# Patient Record
Sex: Male | Born: 1985 | Race: White | Hispanic: No | Marital: Married | Smoking: Never smoker
Health system: Southern US, Community
[De-identification: ages and names within clinical notes are randomized; demographics above are authoritative.]

## PROBLEM LIST (undated history)

## (undated) ENCOUNTER — Emergency Department (HOSPITAL_BASED_OUTPATIENT_CLINIC_OR_DEPARTMENT_OTHER): Admission: EM | Source: Home / Self Care

## (undated) DIAGNOSIS — K219 Gastro-esophageal reflux disease without esophagitis: Secondary | ICD-10-CM

---

## 2006-11-28 ENCOUNTER — Emergency Department (HOSPITAL_COMMUNITY): Admission: EM | Admit: 2006-11-28 | Discharge: 2006-11-28 | Payer: Self-pay | Admitting: Emergency Medicine

## 2007-08-31 ENCOUNTER — Ambulatory Visit: Payer: Self-pay | Admitting: Gastroenterology

## 2007-10-27 DIAGNOSIS — K625 Hemorrhage of anus and rectum: Secondary | ICD-10-CM

## 2007-10-27 DIAGNOSIS — F411 Generalized anxiety disorder: Secondary | ICD-10-CM | POA: Insufficient documentation

## 2007-10-27 DIAGNOSIS — F329 Major depressive disorder, single episode, unspecified: Secondary | ICD-10-CM

## 2007-10-27 DIAGNOSIS — F3289 Other specified depressive episodes: Secondary | ICD-10-CM | POA: Insufficient documentation

## 2011-02-17 NOTE — Letter (Signed)
August 31, 2007    Eduardo Peterson   RE:  TREVINO, WYATT  MRN:  [id number]  /  DOB:  [birth year]   Dear Francee Piccolo:   It is my pleasure to have treated you recently as a new patient in my  office.  I appreciate your confidence and the opportunity to participate  in your care.   Since I do have a busy inpatient endoscopy schedule and office schedule,  my office hours vary weekly.  I am, however, available for emergency  calls every day through my office.  If I cannot promptly meet an urgent  office appointment, another one of our gastroenterologists will be able  to assist you.   My well-trained staff are prepared to help you at all times.  For  emergencies after office hours, a physician from our gastroenterology  section is always available through my 24-hour answering service.   While you are under my care, I encourage discussion of your questions  and concerns, and I will be happy to return your calls as soon as I am  available.   Once again, I welcome you as a new patient and I look forward to a happy  and healthy relationship.    Sincerely,      Eduardo Hair. Arlyce Dice, MD,FACG  Electronically Signed   RDK/MedQ  DD: 08/31/2007  DT: 08/31/2007  Job #: [id number]
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
## 2011-09-22 ENCOUNTER — Other Ambulatory Visit: Payer: Self-pay

## 2011-09-22 ENCOUNTER — Emergency Department (HOSPITAL_COMMUNITY)
Admission: EM | Admit: 2011-09-22 | Discharge: 2011-09-23 | Disposition: A | Discharge status: Home / Self Care | Attending: Emergency Medicine | Admitting: Emergency Medicine

## 2011-09-22 ENCOUNTER — Encounter: Payer: Self-pay | Admitting: Emergency Medicine

## 2011-09-22 DIAGNOSIS — R142 Eructation: Secondary | ICD-10-CM | POA: Insufficient documentation

## 2011-09-22 DIAGNOSIS — Z79899 Other long term (current) drug therapy: Secondary | ICD-10-CM | POA: Insufficient documentation

## 2011-09-22 DIAGNOSIS — R141 Gas pain: Secondary | ICD-10-CM | POA: Insufficient documentation

## 2011-09-22 DIAGNOSIS — R0789 Other chest pain: Secondary | ICD-10-CM | POA: Insufficient documentation

## 2011-09-22 DIAGNOSIS — R112 Nausea with vomiting, unspecified: Secondary | ICD-10-CM | POA: Insufficient documentation

## 2011-09-22 DIAGNOSIS — R1915 Other abnormal bowel sounds: Secondary | ICD-10-CM | POA: Insufficient documentation

## 2011-09-22 NOTE — ED Notes (Signed)
Pt. Received from triage via w/c pt. To stretcher with assist. Pt. Unsteady, mother reports multiple seizure episodes today, pt. Is visiting from [facility], mother reports calling pt's neuro today she has number of on call doctor,

## 2011-09-22 NOTE — ED Notes (Signed)
PT. REPORTS FREQUENT "BURPING " WITH NAUSEA FOR SEVERAL WEEKS , DIAGNOSED WITH GERD BY PCP , REFERRED TO GI MD FOR CONSULT , ALSO REPORTS CHEST CONGESTION .

## 2011-09-23 ENCOUNTER — Emergency Department (HOSPITAL_COMMUNITY)

## 2011-09-23 LAB — POCT I-STAT, CHEM 8
BUN: 13 mg/dL (ref 6–23)
Calcium, Ion: 1.24 mmol/L (ref 1.12–1.32)
Creatinine, Ser: 1 mg/dL (ref 0.50–1.35)
Glucose, Bld: 104 mg/dL — ABNORMAL HIGH (ref 70–99)
HCT: 41 % (ref 39.0–52.0)
Sodium: 142 mEq/L (ref 135–145)

## 2011-09-23 LAB — DIFFERENTIAL
Eosinophils Absolute: 0.1 10*3/uL (ref 0.0–0.7)
Eosinophils Relative: 1 % (ref 0–5)
Lymphs Abs: 1.6 10*3/uL (ref 0.7–4.0)
Monocytes Absolute: 0.5 10*3/uL (ref 0.1–1.0)
Monocytes Relative: 6 % (ref 3–12)

## 2011-09-23 LAB — CBC
HCT: 39.6 % (ref 39.0–52.0)
MCH: 31.6 pg (ref 26.0–34.0)
MCV: 88 fL (ref 78.0–100.0)
Platelets: 229 10*3/uL (ref 150–400)
RBC: 4.5 MIL/uL (ref 4.22–5.81)

## 2011-09-23 MED ORDER — ONDANSETRON 8 MG PO TBDP: ORAL_TABLET | ORAL | Status: AC

## 2011-09-23 MED ORDER — ONDANSETRON HCL 4 MG/2ML IJ SOLN
4.0000 mg | Freq: Once | INTRAMUSCULAR | Status: AC
  Administered 2011-09-23: 4 mg via INTRAVENOUS
  Filled 2011-09-23: qty 2

## 2011-09-23 MED ORDER — GI COCKTAIL ~~LOC~~
30.0000 mL | Freq: Once | ORAL | Status: AC
  Administered 2011-09-23: 30 mL via ORAL
  Filled 2011-09-23: qty 30

## 2011-09-23 MED ORDER — METOCLOPRAMIDE HCL 5 MG/ML IJ SOLN
10.0000 mg | Freq: Once | INTRAMUSCULAR | Status: AC
  Administered 2011-09-23: 10 mg via INTRAVENOUS
  Filled 2011-09-23: qty 2

## 2011-09-23 MED ORDER — SUCRALFATE 1 G PO TABS: 1.0000 g | ORAL_TABLET | Freq: Four times a day (QID) | ORAL | Status: AC

## 2011-09-23 MED ORDER — PROMETHAZINE HCL 25 MG PO TABS: 25.0000 mg | ORAL_TABLET | Freq: Four times a day (QID) | ORAL | Status: AC | PRN

## 2011-09-23 NOTE — Discharge Instructions (Signed)
Please continue to followup with your primary doctor and your GI specialist. If you have worsening symptoms, persistent nausea and vomiting please return to the emergency room.  Clear Liquid Diet The clear liquid dietconsists of foods that are liquid or will become liquid at room temperature.You should be able to see through the liquid and beverages. Examples of foods allowed on a clear liquid diet include fruit juice, broth or bouillon, gelatin, or frozen ice pops. The purpose of this diet is to provide necessary fluid, electrolytes such a sodium and potassium, and energy to keep the body functioning during times when you are not able to consume a regular diet.A clear liquid diet should not be continued for long periods of time as it is not nutritionally adequate.  REASONS FOR USING A CLEAR LIQUID DIET  In sudden onset (acute) conditions for a patient before or after surgery.   As the first step in oral feeding.   For fluid and electrolyte replacement in diarrheal diseases.   As a diet before certain medical tests are performed.  ADEQUACY The clear liquid diet is adequate only in ascorbic acid, according to the Recommended Dietary Allowances of the [employer]. CHOOSING FOODS Breads and Starches  Allowed:  None are allowed.   Avoid: All are avoided.  Vegetables  Allowed:  Strained tomato or vegetable juice.   Avoid: Any others.  Fruit  Allowed:  Strained fruit juices and fruit drinks. Include 1 serving of citrus or vitamin C-enriched fruit juice daily.   Avoid: Any others.  Meat and Meat Substitutes  Allowed:  None are allowed.   Avoid: All are avoided.  Milk  Allowed:  None are allowed.   Avoid: All are avoided.  Soups and Combination Foods  Allowed:  Clear bouillon, broth, or strained broth-based soups.   Avoid: Any others.  Desserts and Sweets  Allowed:  Sugar, honey. High protein gelatin. Flavored gelatin, ices, or frozen ice pops that do not  contain milk.   Avoid: Any others.  Fats and Oils  Allowed:  None are allowed.   Avoid: All are avoided.  Beverages  Allowed:  Carbonated beverages, cereal beverages, coffee (regular or decaffeinated), or tea.   Avoid: Any others.  Condiments  Allowed:  Iodized salt.   Avoid: Any others, including pepper.  Supplements  Allowed:  Liquid nutrition beverages.   Avoid: Any others that contain lactose or fiber.  SAMPLE MEAL PLAN Breakfast  4 oz strained orange juice.    to 1 cup gelatin (plain or fortified).   1 cup beverage (coffee or tea).   Sugar, if desired.  Midmorning Snack   cup gelatin (plain or fortified).  Lunch  1 cup broth or consomm.   4 oz strained grapefruit juice.    cup gelatin (plain or fortified).   1 cup beverage (coffee or tea).   Sugar, if desired.  Midafternoon Snack   cup fruit ice.    cup strained fruit juice.  Dinner  1 cup broth or consomm.    cup cranberry juice.    cup flavored gelatin (plain or fortified).   1 cup beverage (coffee or tea).   Sugar, if desired.  Evening Snack  4 oz strained apple juice (vitamin C-fortified).    cup flavored gelatin (plain or fortified).  Document Released: 09/21/2005 Document Revised: 06/03/2011 Document Reviewed: 12/19/2010 [facility] Patient Information 2012 [place], [place].

## 2011-09-23 NOTE — ED Notes (Signed)
Pt. Alert and oriented, discharged to home ambulatory, gait steady, NAD noted

## 2011-09-23 NOTE — ED Provider Notes (Signed)
Medical screening examination/treatment/procedure(s) were performed by non-physician practitioner and as supervising physician I was immediately available for consultation/collaboration.   Ledger Heindl L Soul Hackman, MD 09/23/11 0518 

## 2011-09-23 NOTE — ED Provider Notes (Addendum)
History     CSN: [id number] Arrival date & time: 09/22/2011 10:38 PM   First MD Initiated Contact with Patient 09/23/11 0136      Chief Complaint  Patient presents with  . Nausea  . Emesis     HPI   History provided by the patient and family. Patient is 25 year old male who presents with symptoms of persistent belching, nausea, vomiting and chest pressure and pain. Patient reports history of similar intermittent symptoms for the past several weeks. He recently saw GI specialist who started him on an acid medications for possible GERD symptoms. he reports taking this regularly as instructed without any significant change. Today symptoms were much worse and persistent. Patient reports having a belch once every minute without stop. Patient denies having hiccups. he does have a followup with his GI specialist for an endoscopy and colonoscopy on January 8. he has no other significant past medical history.   History reviewed. No pertinent past medical history.  History reviewed. No pertinent past surgical history.  No family history on file.  History  Substance Use Topics  . Smoking status: Never Smoker   . Smokeless tobacco: Not on file  . Alcohol Use: No      Review of Systems  Constitutional: Negative for fever and chills.  HENT: Negative for sore throat.   Respiratory: Negative for cough and shortness of breath.   Gastrointestinal: Positive for nausea and vomiting. Negative for abdominal pain, diarrhea and constipation.  All other systems reviewed and are negative.    Allergies  Review of patient's allergies indicates no known allergies.  Home Medications   Current Outpatient Rx  Name Route Sig Dispense Refill  . OMEPRAZOLE 20 MG PO CPDR Oral Take 20 mg by mouth 2 (two) times daily.        BP 122/74  Pulse 101  Temp(Src) 98.2 F (36.8 C) (Oral)  Resp 22  SpO2 97%  Physical Exam  Nursing note and vitals reviewed. Constitutional: He is oriented to person,  place, and time. He appears well-developed and well-nourished. No distress.  HENT:  Head: Normocephalic.  Mouth/Throat: Oropharynx is clear and moist.  Neck: Normal range of motion. Neck supple. No tracheal deviation present.       No crepitus  Cardiovascular: Normal rate, regular rhythm and normal heart sounds.   Pulmonary/Chest: Effort normal and breath sounds normal. No respiratory distress. He has no wheezes. He has no rales.  Abdominal: Soft. He exhibits no distension. Bowel sounds are increased. There is no tenderness. There is no rebound and no guarding.  Neurological: He is alert and oriented to person, place, and time.  Skin: Skin is warm.  Psychiatric: He has a normal mood and affect. His behavior is normal.    ED Course  Procedures (including critical care time)  Labs Reviewed  POCT I-STAT, CHEM 8 - Abnormal; Notable for the following:    Glucose, Bld 104 (*)    All other components within normal limits  CBC  DIFFERENTIAL  I-STAT, CHEM 8   Results for orders placed during the hospital encounter of 09/22/11  CBC      Component Value Range   WBC 8.8  4.0 - 10.5 (K/uL)   RBC 4.50  4.22 - 5.81 (MIL/uL)   Hemoglobin 14.2  13.0 - 17.0 (g/dL)   HCT 40.9  81.1 - 91.4 (%)   MCV 88.0  78.0 - 100.0 (fL)   MCH 31.6  26.0 - 34.0 (pg)   MCHC 35.9  30.0 -  36.0 (g/dL)   RDW 96.0  45.4 - 09.8 (%)   Platelets 229  150 - 400 (K/uL)  DIFFERENTIAL      Component Value Range   Neutrophils Relative 75  43 - 77 (%)   Neutro Abs 6.6  1.7 - 7.7 (K/uL)   Lymphocytes Relative 18  12 - 46 (%)   Lymphs Abs 1.6  0.7 - 4.0 (K/uL)   Monocytes Relative 6  3 - 12 (%)   Monocytes Absolute 0.5  0.1 - 1.0 (K/uL)   Eosinophils Relative 1  0 - 5 (%)   Eosinophils Absolute 0.1  0.0 - 0.7 (K/uL)   Basophils Relative 0  0 - 1 (%)   Basophils Absolute 0.0  0.0 - 0.1 (K/uL)  POCT I-STAT, CHEM 8      Component Value Range   Sodium 142  135 - 145 (mEq/L)   Potassium 3.8  3.5 - 5.1 (mEq/L)   Chloride  106  96 - 112 (mEq/L)   BUN 13  6 - 23 (mg/dL)   Creatinine, Ser 1.19  0.50 - 1.35 (mg/dL)   Glucose, Bld 147 (*) 70 - 99 (mg/dL)   Calcium, Ion 8.29  5.62 - 1.32 (mmol/L)   TCO2 26  0 - 100 (mmol/L)   Hemoglobin 13.9  13.0 - 17.0 (g/dL)   HCT 13.0  86.5 - 78.4 (%)     Dg Chest 2 View  09/23/2011  *RADIOLOGY REPORT*  Clinical Data: Mid chest pain  CHEST - 2 VIEW  Comparison: None.  Findings: Lungs are clear. No pleural effusion or pneumothorax. The cardiomediastinal contours are within normal limits. The visualized bones and soft tissues are without significant appreciable abnormality.  IMPRESSION: No acute cardiopulmonary process.  Original Report Authenticated By: Waneta Martins, M.D.     1. Nausea & vomiting      MDM  1:35 AM patient seen and evaluated. Patient in no acute distress. he reports some improvements of symptoms after Zofran.   Pt continues to do well and has not had any belching or N/V.  Pt feels ready to return home.    Date: 09/23/2011  Rate: 87  Rhythm: normal sinus rhythm and sinus arrhythmia  QRS Axis: normal  Intervals: normal  ST/T Wave abnormalities: normal  Conduction Disutrbances:nonspecific intraventricular conduction delay  Narrative Interpretation:   Old EKG Reviewed: none available    Angus Seller, PA 09/23/11 0419  Angus Seller, PA 09/23/11 [phone]

## 2017-01-10 ENCOUNTER — Emergency Department (HOSPITAL_BASED_OUTPATIENT_CLINIC_OR_DEPARTMENT_OTHER)

## 2017-01-10 ENCOUNTER — Emergency Department (HOSPITAL_BASED_OUTPATIENT_CLINIC_OR_DEPARTMENT_OTHER)
Admission: EM | Admit: 2017-01-10 | Discharge: 2017-01-11 | Disposition: A | Discharge status: Home / Self Care | Attending: Emergency Medicine | Admitting: Emergency Medicine

## 2017-01-10 ENCOUNTER — Encounter (HOSPITAL_BASED_OUTPATIENT_CLINIC_OR_DEPARTMENT_OTHER): Payer: Self-pay | Admitting: *Deleted

## 2017-01-10 DIAGNOSIS — R0789 Other chest pain: Secondary | ICD-10-CM | POA: Diagnosis not present

## 2017-01-10 DIAGNOSIS — Z87891 Personal history of nicotine dependence: Secondary | ICD-10-CM | POA: Diagnosis not present

## 2017-01-10 HISTORY — DX: Gastro-esophageal reflux disease without esophagitis: K21.9

## 2017-01-10 LAB — CBC WITH DIFFERENTIAL/PLATELET
BASOS PCT: 0 %
Basophils Absolute: 0 10*3/uL (ref 0.0–0.1)
EOS PCT: 5 %
Eosinophils Absolute: 0.4 10*3/uL (ref 0.0–0.7)
HCT: 43 % (ref 39.0–52.0)
HEMOGLOBIN: 15.6 g/dL (ref 13.0–17.0)
LYMPHS ABS: 2.4 10*3/uL (ref 0.7–4.0)
Lymphocytes Relative: 33 %
MCH: 31.7 pg (ref 26.0–34.0)
MCHC: 36.3 g/dL — AB (ref 30.0–36.0)
MCV: 87.4 fL (ref 78.0–100.0)
Monocytes Absolute: 0.6 10*3/uL (ref 0.1–1.0)
Monocytes Relative: 8 %
Neutro Abs: 4.1 10*3/uL (ref 1.7–7.7)
Neutrophils Relative %: 54 %
Platelets: 259 10*3/uL (ref 150–400)
RBC: 4.92 MIL/uL (ref 4.22–5.81)
RDW: 12.4 % (ref 11.5–15.5)
WBC: 7.5 10*3/uL (ref 4.0–10.5)

## 2017-01-10 LAB — BASIC METABOLIC PANEL
Anion gap: 8 (ref 5–15)
BUN: 13 mg/dL (ref 6–20)
CO2: 27 mmol/L (ref 22–32)
CREATININE: 0.92 mg/dL (ref 0.61–1.24)
Calcium: 9.1 mg/dL (ref 8.9–10.3)
Chloride: 103 mmol/L (ref 101–111)
GFR calc Af Amer: >60 mL/min (ref >60)
Glucose, Bld: 108 mg/dL — ABNORMAL HIGH (ref 65–99)
Potassium: 3.4 mmol/L — ABNORMAL LOW (ref 3.5–5.1)
SODIUM: 138 mmol/L (ref 135–145)

## 2017-01-10 LAB — D-DIMER, QUANTITATIVE: D-Dimer, Quant: <0.27 ug/mL-FEU (ref 0.00–0.50)

## 2017-01-10 LAB — TROPONIN I

## 2017-01-10 NOTE — ED Provider Notes (Signed)
[facility] Provider Note   CSN: [id number] Arrival date & time: 01/10/17  1747  By signing my name below, I, [name]., attest that this documentation has been prepared under the direction and in the presence of Rolan Bucco, MD. Electronically signed: [name]., ED Scribe. 01/10/17. 10:24 PM.   History   Chief Complaint Chief Complaint  Patient presents with  . Chest Pain    HPI Eduardo Peterson is a 31 y.o. male with hx of panic attack who presents to the Emergency Department complaining of constant, mild chest pain with onset x2 weeks. Pt states that for the past x2 weeks he has had a constant dull chest pain with an occasional sharp pain, but he has been unable to come into the ED due to work. x6 hours ago, pt states that he experienced an "excruciating" sharp central chest pain. He denies any modifying factors. Pt denies SOB, cough, congestion, fever, nausea, vomiting, diarrhea, leg swelling. Pt denies hx of heart complication. He reports family hx of stroke. Pt denies smoking. He reports a trip to [place] x1 month ago. Of note, pt states that his ankles were abnormally swollen x2 weeks ago.    The history is provided by the patient. No language interpreter was used.    Past Medical History:  Diagnosis Date  . GERD (gastroesophageal reflux disease)     Patient Active Problem List   Diagnosis Date Noted  . ANXIETY 10/27/2007  . DEPRESSION 10/27/2007  . RECTAL BLEEDING 10/27/2007    History reviewed. No pertinent surgical history.     Home Medications    Prior to Admission medications   Medication Sig Start Date End Date Taking? Authorizing Provider  omeprazole (PRILOSEC) 20 MG capsule Take 20 mg by mouth 2 (two) times daily.      Historical Provider, MD  sucralfate (CARAFATE) 1 G tablet Take 1 tablet (1 g total) by mouth 4 (four) times daily. 09/23/11 09/22/12  Ivonne Andrew, PA-C    Family History No family history on  file.  Social History Social History  Substance Use Topics  . Smoking status: Never Smoker  . Smokeless tobacco: Former Neurosurgeon  . Alcohol use Yes     Comment: occ     Allergies   Patient has no known allergies.   Review of Systems Review of Systems  Constitutional: Negative for chills, diaphoresis, fatigue and fever.  HENT: Negative for congestion, rhinorrhea and sneezing.   Eyes: Negative.   Respiratory: Negative for cough, chest tightness and shortness of breath.   Cardiovascular: Positive for chest pain. Negative for leg swelling.  Gastrointestinal: Negative for abdominal pain, blood in stool, diarrhea, nausea and vomiting.  Genitourinary: Negative for difficulty urinating, flank pain, frequency and hematuria.  Musculoskeletal: Negative for arthralgias and back pain.  Skin: Negative for rash.  Neurological: Negative for dizziness, speech difficulty, weakness, numbness and headaches.     Physical Exam Updated Vital Signs BP (!) 121/91 (BP Location: Right Arm)   Pulse 71   Resp 14   SpO2 99%   Physical Exam  Constitutional: He is oriented to person, place, and time. He appears well-developed and well-nourished.  HENT:  Head: Normocephalic and atraumatic.  Eyes: Pupils are equal, round, and reactive to light.  Neck: Normal range of motion. Neck supple.  Cardiovascular: Normal rate, regular rhythm and normal heart sounds.   Pulmonary/Chest: Effort normal and breath sounds normal. No respiratory distress. He has no wheezes. He has no rales.  He exhibits no tenderness.  Abdominal: Soft. Bowel sounds are normal. There is no tenderness. There is no rebound and no guarding.  Musculoskeletal: Normal range of motion. He exhibits no edema.       Right lower leg: He exhibits no tenderness and no edema.       Left lower leg: He exhibits no tenderness and no edema.  No edema or calf tenderness.   Lymphadenopathy:    He has no cervical adenopathy.  Neurological: He is alert and  oriented to person, place, and time.  Skin: Skin is warm and dry. No rash noted.  Psychiatric: He has a normal mood and affect.     ED Treatments / Results   DIAGNOSTIC STUDIES:   COORDINATION OF CARE: 10:24 PM-Discussed next steps with pt. Pt verbalized understanding and is agreeable with the plan.    Labs (all labs ordered are listed, but only abnormal results are displayed) Labs Reviewed  BASIC METABOLIC PANEL - Abnormal; Notable for the following:       Result Value   Potassium 3.4 (*)    Glucose, Bld 108 (*)    All other components within normal limits  CBC WITH DIFFERENTIAL/PLATELET - Abnormal; Notable for the following:    MCHC 36.3 (*)    All other components within normal limits  D-DIMER, QUANTITATIVE (NOT AT [facility])  TROPONIN I    EKG  EKG Interpretation  Date/Time:  Sunday January 10 2017 17:54:51 EDT Ventricular Rate:  80 PR Interval:  156 QRS Duration: 108 QT Interval:  376 QTC Calculation: 433 R Axis:   68 Text Interpretation:  Sinus rhythm with marked sinus arrhythmia Otherwise normal ECG since last tracing no significant change Confirmed by Abdurrahman Petersheim  MD, Millee Denise ([id number]) on 01/10/2017 6:03:34 PM       Radiology Dg Chest 2 View  Result Date: 01/10/2017 CLINICAL DATA:  Chest pain EXAM: CHEST  2 VIEW COMPARISON:  09/23/2011 chest radiograph. FINDINGS: Stable cardiomediastinal silhouette with normal heart size. No pneumothorax. No pleural effusion. Lungs appear clear, with no acute consolidative airspace disease and no pulmonary edema. IMPRESSION: No active cardiopulmonary disease. Electronically Signed   By: Delbert Phenix M.D.   On: 01/10/2017 19:26    Procedures Procedures (including critical care time)  Medications Ordered in ED Medications - No data to display   Initial Impression / Assessment and Plan / ED Course  I have reviewed the triage vital signs and the nursing notes.  Pertinent labs & imaging results that were available during my care of the  patient were reviewed by me and considered in my medical decision making (see chart for details).     Patient presents with chest pain. It's been constant for 2 weeks. It's nonexertional. It's not related to breathing. There is no suggestions of pulmonary embolus. No pneumothorax. No pneumonia. No ischemic changes on EKG. He has a low heart score. He was discharged home in good condition. I encouraged him to try ibuprofen for symptomatically. I encouraged him to follow-up with his PCP. Return precautions were given.  Final Clinical Impressions(s) / ED Diagnoses   Final diagnoses:  Atypical chest pain    New Prescriptions New Prescriptions   No medications on file   I personally performed the services described in this documentation, which was scribed in my presence.  The recorded information has been reviewed and considered.     Rolan Bucco, MD 01/10/17 2225

## 2017-01-10 NOTE — ED Triage Notes (Signed)
Pt reports central chest pain x2wks. Denies sob, dizziness, n/v, other associated symptoms.

## 2017-01-10 NOTE — ED Notes (Signed)
Patient transported to X-ray 

## 2017-01-11 NOTE — ED Notes (Addendum)
Pt d/c by Dallas Schimke RN. Last pain charted is 2/10

## 2017-11-21 ENCOUNTER — Emergency Department (HOSPITAL_BASED_OUTPATIENT_CLINIC_OR_DEPARTMENT_OTHER)

## 2017-11-21 ENCOUNTER — Encounter (HOSPITAL_BASED_OUTPATIENT_CLINIC_OR_DEPARTMENT_OTHER): Payer: Self-pay | Admitting: *Deleted

## 2017-11-21 ENCOUNTER — Other Ambulatory Visit: Payer: Self-pay

## 2017-11-21 DIAGNOSIS — X500XXA Overexertion from strenuous movement or load, initial encounter: Secondary | ICD-10-CM | POA: Insufficient documentation

## 2017-11-21 DIAGNOSIS — Y9367 Activity, basketball: Secondary | ICD-10-CM | POA: Diagnosis not present

## 2017-11-21 DIAGNOSIS — Y999 Unspecified external cause status: Secondary | ICD-10-CM | POA: Insufficient documentation

## 2017-11-21 DIAGNOSIS — Z79899 Other long term (current) drug therapy: Secondary | ICD-10-CM | POA: Diagnosis not present

## 2017-11-21 DIAGNOSIS — S99921A Unspecified injury of right foot, initial encounter: Secondary | ICD-10-CM | POA: Diagnosis present

## 2017-11-21 DIAGNOSIS — S92254A Nondisplaced fracture of navicular [scaphoid] of right foot, initial encounter for closed fracture: Secondary | ICD-10-CM | POA: Diagnosis not present

## 2017-11-21 DIAGNOSIS — Y929 Unspecified place or not applicable: Secondary | ICD-10-CM | POA: Insufficient documentation

## 2017-11-21 NOTE — ED Triage Notes (Signed)
Pt reports he rolled his ankle tonight at [employer] playing [employer]

## 2017-11-22 ENCOUNTER — Emergency Department (HOSPITAL_BASED_OUTPATIENT_CLINIC_OR_DEPARTMENT_OTHER)
Admission: EM | Admit: 2017-11-22 | Discharge: 2017-11-22 | Disposition: A | Discharge status: Home / Self Care | Attending: Emergency Medicine | Admitting: Emergency Medicine

## 2017-11-22 DIAGNOSIS — S92254A Nondisplaced fracture of navicular [scaphoid] of right foot, initial encounter for closed fracture: Secondary | ICD-10-CM

## 2017-11-22 MED ORDER — IBUPROFEN 400 MG PO TABS: 400.0000 mg | ORAL_TABLET | Freq: Four times a day (QID) | ORAL | 0 refills | Status: AC | PRN

## 2017-11-22 MED ORDER — IBUPROFEN 800 MG PO TABS
800.0000 mg | ORAL_TABLET | Freq: Once | ORAL | Status: AC
  Administered 2017-11-22: 800 mg via ORAL
  Filled 2017-11-22: qty 1

## 2017-11-22 MED ORDER — ACETAMINOPHEN 500 MG PO TABS
1000.0000 mg | ORAL_TABLET | Freq: Once | ORAL | Status: AC
  Administered 2017-11-22: 1000 mg via ORAL

## 2017-11-22 MED ORDER — ACETAMINOPHEN 500 MG PO TABS
ORAL_TABLET | ORAL | Status: AC
  Filled 2017-11-22: qty 2

## 2017-11-22 MED ORDER — HYDROCODONE-ACETAMINOPHEN 5-325 MG PO TABS: 1.0000 | ORAL_TABLET | Freq: Four times a day (QID) | ORAL | 0 refills | Status: AC | PRN

## 2017-11-22 NOTE — ED Provider Notes (Signed)
[facility] EMERGENCY DEPARTMENT Provider Note   CSN: [id number] Arrival date & time: 11/21/17  2225     History   Chief Complaint Chief Complaint  Patient presents with  . Ankle Pain    HPI Eduardo Peterson is a 32 y.o. male.  The history is provided by the patient.  Ankle Pain   Incident onset: Just prior to arrival. Incident location: Playing basketball. The injury mechanism was a fall. The pain is present in the right ankle and right foot. The pain is moderate. The pain has been constant since onset. Associated symptoms include inability to bear weight. The symptoms are aggravated by bearing weight. He has tried rest for the symptoms. The treatment provided mild relief.   Patient reports he was jumping up for a rebound when he fell down and injured his right foot.  He reports he heard a pop.  He reports pain in the ankle and foot. Past Medical History:  Diagnosis Date  . GERD (gastroesophageal reflux disease)     Patient Active Problem List   Diagnosis Date Noted  . ANXIETY 10/27/2007  . DEPRESSION 10/27/2007  . RECTAL BLEEDING 10/27/2007    History reviewed. No pertinent surgical history.     Home Medications    Prior to Admission medications   Medication Sig Start Date End Date Taking? Authorizing Provider  citalopram (CELEXA) 20 MG tablet Take 20 mg by mouth daily.   Yes [provider]  omeprazole (PRILOSEC) 20 MG capsule Take 20 mg by mouth 2 (two) times daily.      [provider]  sucralfate (CARAFATE) 1 G tablet Take 1 tablet (1 g total) by mouth 4 (four) times daily. 09/23/11 09/22/12  Ivonne Andrewammen, Peter, PA-C    Family History No family history on file.  Social History Social History   Tobacco Use  . Smoking status: Never Smoker  . Smokeless tobacco: Former [employer], [employer]  Substance Use Topics  . Alcohol use: Yes    Comment: occ  . Drug use: No     Allergies   Patient has no known allergies.   Review of Systems Review of  Systems  Constitutional: Negative for fever.  Gastrointestinal: Negative for vomiting.  Musculoskeletal: Positive for arthralgias and joint swelling.  Psychiatric/Behavioral: Hallucinations:      Physical Exam Updated Vital Signs BP 116/78 (BP Location: Right Arm)   Pulse 74   Temp 99.4 F (37.4 C) (Oral)   Resp 16   Ht 1.803 m (5\' 11" )   Wt 83.9 kg (185 lb)   SpO2 98%   BMI 25.80 kg/m   Physical Exam  CONSTITUTIONAL: Well developed/well nourished HEAD: Normocephalic/atraumatic EYES: EOMI ENMT: Mucous membranes moist NECK: supple no meningeal signs LUNGS: no apparent distress ABDOMEN: soft NEURO: Pt is awake/alert/appropriate, moves all extremitiesx4.  No facial droop.   EXTREMITIES: pulses normal/equal, full ROM Swelling/tenderness to right dorsal foot.  No deformities noted.  Distal pulses intact.  Able to move toes on right foot.  Mild tenderness to bilateral malleoli on right leg.  There is no right knee or proximal fibular tenderness. Right Achilles intact SKIN: warm, color normal PSYCH: no abnormalities of mood noted, alert and oriented to situation  ED Treatments / Results  Labs (all labs ordered are listed, but only abnormal results are displayed) Labs Reviewed - No data to display  EKG  EKG Interpretation None       Radiology Dg Ankle Complete Right  Result Date: 11/21/2017 CLINICAL DATA:  Right foot  and ankle pain after twisting injury and fall playing basketball tonight. Swelling. EXAM: RIGHT ANKLE - COMPLETE 3+ VIEW COMPARISON:  Right foot radiograph 06/20/2014 FINDINGS: Small curvilinear density about the dorsum of the navicular concerning for acute avulsion fracture. No additional fracture of the ankle. The alignment and ankle mortise are preserved. Tiny plantar calcaneal spur. IMPRESSION: Findings suspicious for small dorsal navicular avulsion fracture. Electronically Signed   By: Rubye Oaks M.D.   On: 11/21/2017 23:30   Dg Foot Complete  Right  Result Date: 11/21/2017 CLINICAL DATA:  Right foot and ankle pain after fall, twisting injury playing basketball. Swelling. EXAM: RIGHT FOOT COMPLETE - 3+ VIEW COMPARISON:  Foot radiograph 06/20/2014 FINDINGS: Findings suspicious for small avulsion fracture from the dorsal navicular. No additional acute fracture. The alignment and joint spaces are maintained. Mild dorsal soft tissue edema. IMPRESSION: Findings suspicious for small avulsion fracture from the dorsal navicular. Electronically Signed   By: Rubye Oaks M.D.   On: 11/21/2017 23:31    Procedures Procedures  SPLINT APPLICATION Date/Time: 1:34 AM Authorized by: Joya Gaskins Consent: Verbal consent obtained. Risks and benefits: risks, benefits and alternatives were discussed Consent given by: patient Splint applied by: orthopedic technician Location details: right LE Splint type: posterior Supplies used: ortho glass Post-procedure: The splinted body part was neurovascularly unchanged following the procedure. Patient tolerance: Patient tolerated the procedure well with no immediate complications.    Medications Ordered in ED Medications  ibuprofen (ADVIL,MOTRIN) tablet 800 mg (800 mg Oral Given 11/22/17 0120)  acetaminophen (TYLENOL) tablet 1,000 mg (1,000 mg Oral Given 11/22/17 0120)     Initial Impression / Assessment and Plan / ED Course  I have reviewed the triage vital signs and the nursing notes. Narcotic database reviewed and considered in decision making Pertinent maging results that were available during my care of the patient were reviewed by me and considered in my medical decision making (see chart for details).     Patient with a navicular avulsion fracture Prefers to use crutches and splint.  Will refer to sports medicine.  Final Clinical Impressions(s) / ED Diagnoses   Final diagnoses:  Closed nondisplaced fracture of navicular bone of right foot, initial encounter    ED Discharge  Orders        Ordered    HYDROcodone-acetaminophen (NORCO/VICODIN) 5-325 MG tablet  Every 6 hours PRN     11/22/17 0217    ibuprofen (ADVIL,MOTRIN) 400 MG tablet  Every 6 hours PRN     11/22/17 0217       Zadie Rhine, MD 11/22/17 [phone]

## 2017-11-22 NOTE — ED Notes (Signed)
Denies questions or needs. Given Rx x2. Out on crutches with steady gait. Splint in place. "feel better".

## 2017-11-22 NOTE — ED Notes (Addendum)
Alert, NAD, calm, interactive, resps e/u, speaking in clear complete sentences, no dyspnea noted, skin W&D, initial VSS, c/o R lateral ankle/foot pain and swelling, "heard/felt a pop when I cam down on it playing basketball", (denies: numbness, tingling or weakness). EDP Dr. Bebe ShaggyWickline into room. Pt has crutches. H/o various BLE injuries. RLE iced and elevated.

## 2019-02-12 IMAGING — DX DG CHEST 2V
2 series · 2 of 2 positions shown · non-contrast
Comparison: 09/23/2011 chest radiograph.

CLINICAL DATA: Chest pain

EXAM:
CHEST  2 VIEW

[chest pa]
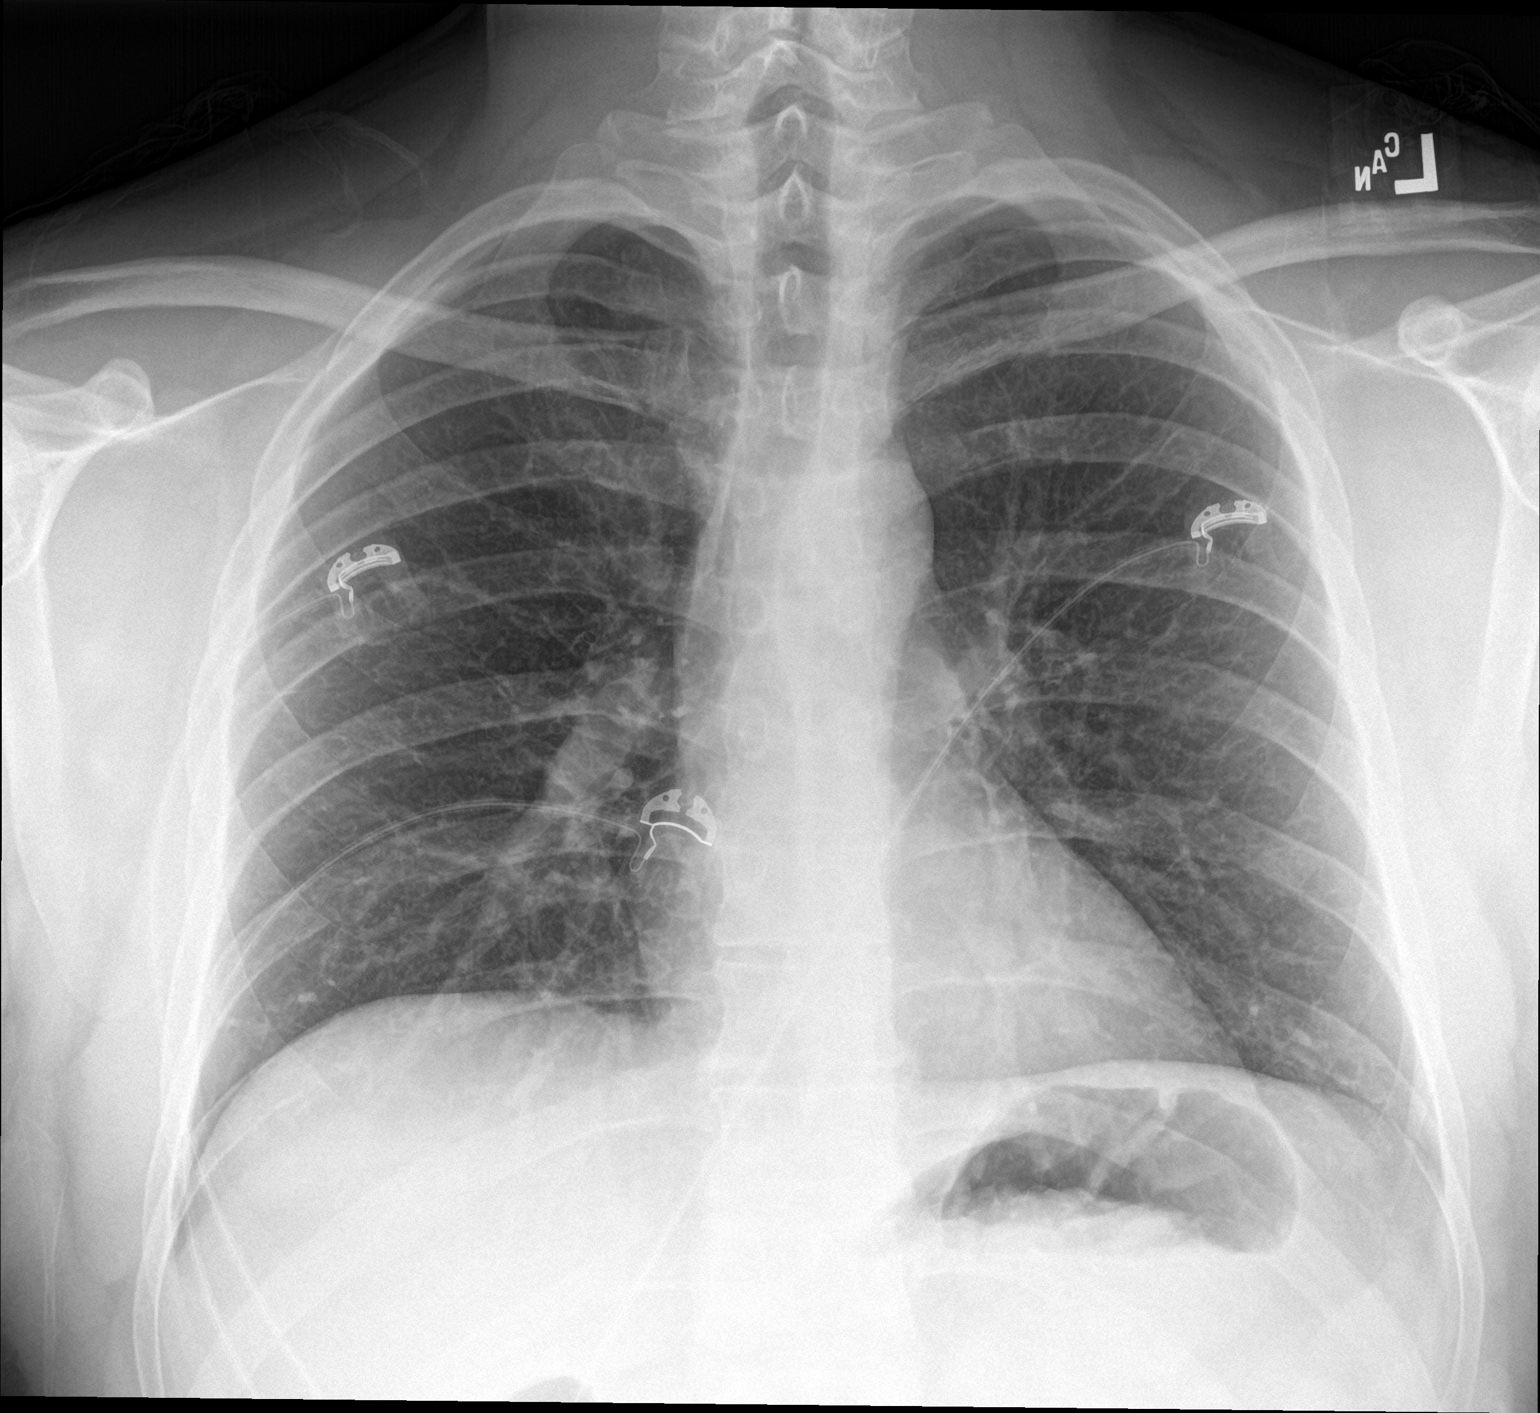

[chest lat]
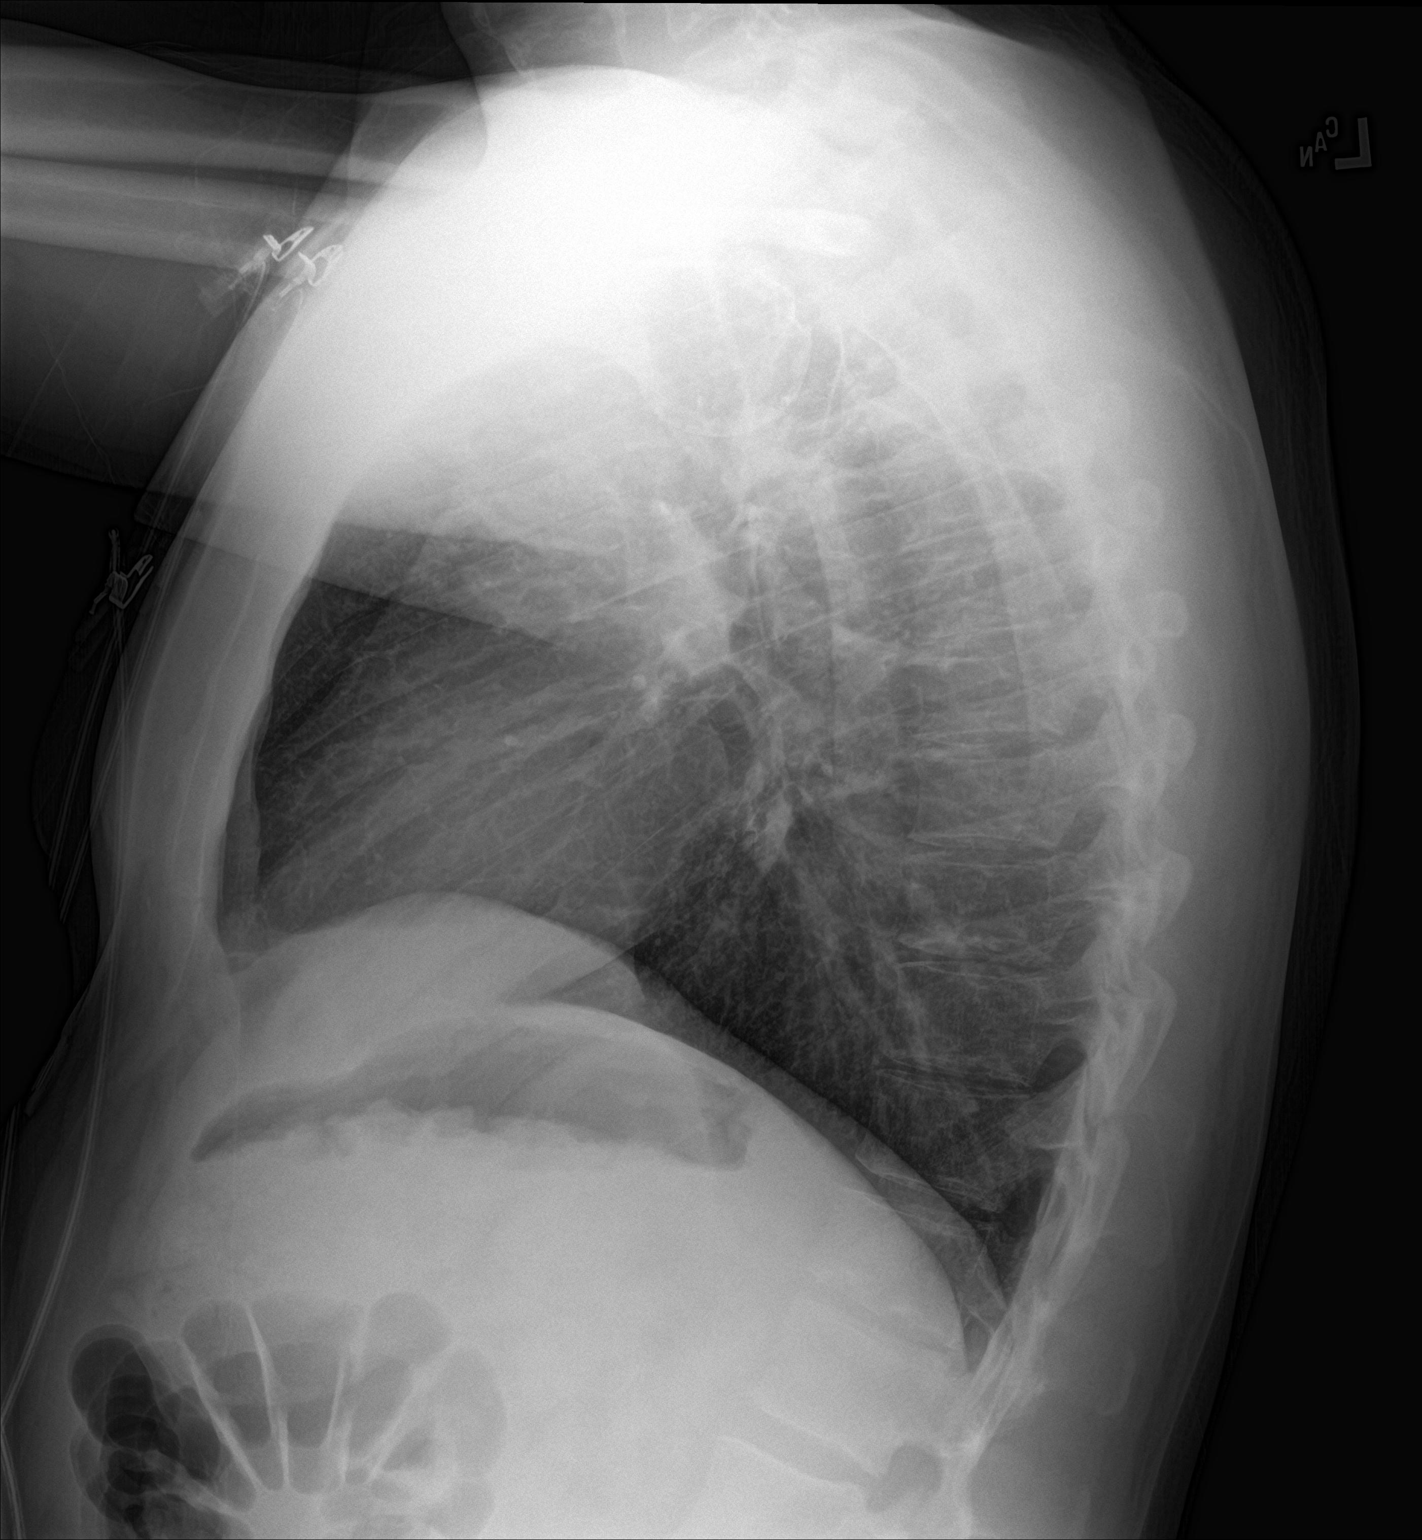

[2 of 2 positions shown; findings below may reference images not displayed]

FINDINGS: Stable cardiomediastinal silhouette with normal heart size. No
pneumothorax. No pleural effusion. Lungs appear clear, with no acute
consolidative airspace disease and no pulmonary edema.
IMPRESSION: No active cardiopulmonary disease.

## 2023-03-10 ENCOUNTER — Other Ambulatory Visit: Payer: Self-pay

## 2023-03-10 ENCOUNTER — Emergency Department (HOSPITAL_BASED_OUTPATIENT_CLINIC_OR_DEPARTMENT_OTHER)

## 2023-03-10 ENCOUNTER — Encounter (HOSPITAL_BASED_OUTPATIENT_CLINIC_OR_DEPARTMENT_OTHER): Payer: Self-pay | Admitting: Emergency Medicine

## 2023-03-10 ENCOUNTER — Emergency Department (HOSPITAL_BASED_OUTPATIENT_CLINIC_OR_DEPARTMENT_OTHER)
Admission: EM | Admit: 2023-03-10 | Discharge: 2023-03-11 | Disposition: A | Discharge status: Home / Self Care | Attending: Emergency Medicine | Admitting: Emergency Medicine

## 2023-03-10 DIAGNOSIS — R0789 Other chest pain: Secondary | ICD-10-CM | POA: Insufficient documentation

## 2023-03-10 LAB — CBC WITH DIFFERENTIAL/PLATELET
Abs Immature Granulocytes: 0.01 10*3/uL (ref 0.00–0.07)
Basophils Absolute: 0 10*3/uL (ref 0.0–0.1)
Basophils Relative: 1 %
Eosinophils Absolute: 0.2 10*3/uL (ref 0.0–0.5)
Eosinophils Relative: 3 %
HCT: 42.1 % (ref 39.0–52.0)
Hemoglobin: 14.9 g/dL (ref 13.0–17.0)
Immature Granulocytes: 0 %
Lymphocytes Relative: 32 %
Lymphs Abs: 1.9 10*3/uL (ref 0.7–4.0)
MCH: 31.1 pg (ref 26.0–34.0)
MCHC: 35.4 g/dL (ref 30.0–36.0)
MCV: 87.9 fL (ref 80.0–100.0)
Monocytes Absolute: 0.4 10*3/uL (ref 0.1–1.0)
Monocytes Relative: 7 %
Neutro Abs: 3.4 10*3/uL (ref 1.7–7.7)
Neutrophils Relative %: 57 %
Platelets: 255 10*3/uL (ref 150–400)
RBC: 4.79 MIL/uL (ref 4.22–5.81)
RDW: 12.5 % (ref 11.5–15.5)
WBC: 5.9 10*3/uL (ref 4.0–10.5)
nRBC: 0 % (ref 0.0–0.2)

## 2023-03-10 LAB — COMPREHENSIVE METABOLIC PANEL
ALT: 20 U/L (ref 0–44)
AST: 21 U/L (ref 15–41)
Albumin: 4 g/dL (ref 3.5–5.0)
Alkaline Phosphatase: 67 U/L (ref 38–126)
Anion gap: 10 (ref 5–15)
BUN: 9 mg/dL (ref 6–20)
CO2: 25 mmol/L (ref 22–32)
Calcium: 8.9 mg/dL (ref 8.9–10.3)
Chloride: 102 mmol/L (ref 98–111)
Creatinine, Ser: 0.82 mg/dL (ref 0.61–1.24)
GFR, Estimated: >60 mL/min (ref >60)
Glucose, Bld: 86 mg/dL (ref 70–99)
Potassium: 3.4 mmol/L — ABNORMAL LOW (ref 3.5–5.1)
Sodium: 137 mmol/L (ref 135–145)
Total Bilirubin: 0.6 mg/dL (ref 0.3–1.2)
Total Protein: 7.3 g/dL (ref 6.5–8.1)

## 2023-03-10 LAB — TROPONIN I (HIGH SENSITIVITY): Troponin I (High Sensitivity): <2 ng/L (ref <18)

## 2023-03-10 NOTE — ED Triage Notes (Addendum)
Pt c/o RT CP since Monday; pain increases with breathing and palpation; denies other sxs; reports approx 20 lb unintentional weight loss in 2 mos

## 2023-03-11 LAB — TROPONIN I (HIGH SENSITIVITY): Troponin I (High Sensitivity): <2 ng/L (ref <18)

## 2023-03-11 MED ORDER — KETOROLAC TROMETHAMINE 60 MG/2ML IM SOLN
60.0000 mg | Freq: Once | INTRAMUSCULAR | Status: AC
  Administered 2023-03-11: 60 mg via INTRAMUSCULAR
  Filled 2023-03-11: qty 2

## 2023-03-11 NOTE — ED Provider Notes (Signed)
[place] EMERGENCY DEPARTMENT AT [facility] Provider Note   CSN: [id number] Arrival date & time: 03/10/23  2252     History  Chief Complaint  Patient presents with   Chest Pain    Eduardo Peterson is a 37 y.o. male.  The history is provided by the patient.  Chest Pain Eduardo Peterson is a 37 y.o. male who presents to the Emergency Department complaining of chest pain.  Presents to the emergency department complaining of right-sided chest pain that started on Monday.  Pain is worse with palpation as well as deep breaths.  It is dull in nature.  No reported injuries.  No fever, cough, difficulty breathing, abdominal pain, nausea, vomiting, leg swelling or pain.  He has no known medical problems.  No tobacco, alcohol, drugs.   Family hx cad mom 54. No surgeries.  No hormones. No dvt/pe.   No family hx/o cancer.  He has had 20 pounds of unintentional weight loss over the last few months but he is retiring from [employer].      Home Medications Prior to Admission medications   Medication Sig Start Date End Date Taking? Authorizing Provider  citalopram (CELEXA) 20 MG tablet Take 20 mg by mouth daily.    [provider]  HYDROcodone-acetaminophen (NORCO/VICODIN) 5-325 MG tablet Take 1 tablet by mouth every 6 (six) hours as needed. 11/22/17   Zadie Rhine, MD  ibuprofen (ADVIL,MOTRIN) 400 MG tablet Take 1 tablet (400 mg total) by mouth every 6 (six) hours as needed for moderate pain. 11/22/17   Zadie Rhine, MD  omeprazole (PRILOSEC) 20 MG capsule Take 20 mg by mouth 2 (two) times daily.      [provider]  sucralfate (CARAFATE) 1 G tablet Take 1 tablet (1 g total) by mouth 4 (four) times daily. 09/23/11 09/22/12  Ivonne Andrew, PA-C      Allergies    Patient has no known allergies.    Review of Systems   Review of Systems  Cardiovascular:  Positive for chest pain.  All other systems reviewed and are negative.   Physical Exam Updated  Vital Signs BP 114/79 (BP Location: Right Arm)   Pulse 74   Temp 97.8 F (36.6 C)   Resp 20   Ht 5\' 11"  (1.803 m)   Wt 78 kg   SpO2 100%   BMI 23.99 kg/m  Physical Exam Vitals and nursing note reviewed.  Constitutional:      Appearance: He is well-developed.  HENT:     Head: Normocephalic and atraumatic.  Cardiovascular:     Rate and Rhythm: Normal rate and regular rhythm.     Heart sounds: No murmur heard. Pulmonary:     Effort: Pulmonary effort is normal. No respiratory distress.     Breath sounds: Normal breath sounds.     Comments: Right sided chest wall tenderness without overlying rashes or masses Abdominal:     Palpations: Abdomen is soft.     Tenderness: There is no abdominal tenderness. There is no guarding or rebound.  Musculoskeletal:        General: No swelling or tenderness.  Skin:    General: Skin is warm and dry.  Neurological:     Mental Status: He is alert and oriented to person, place, and time.  Psychiatric:        Behavior: Behavior normal.     ED Results / Procedures / Treatments   Labs (all labs ordered are listed, but only abnormal  results are displayed) Labs Reviewed  COMPREHENSIVE METABOLIC PANEL - Abnormal; Notable for the following components:      Result Value   Potassium 3.4 (*)    All other components within normal limits  CBC WITH DIFFERENTIAL/PLATELET  TROPONIN I (HIGH SENSITIVITY)  TROPONIN I (HIGH SENSITIVITY)    EKG None ED ECG REPORT   Date: 03/11/2023  Rate: 57  Rhythm: normal sinus rhythm  QRS Axis: normal  ST/T Wave abnormalities: normal  Conduction Disutrbances:nonspecific intraventricular conduction delay  Narrative Interpretation:   Old EKG Reviewed: none available  I have personally reviewed the EKG tracing and agree with the computerized printout as noted.  Radiology DG Chest 2 View  Result Date: 03/10/2023 CLINICAL DATA:  Chest pain. Pain increases with breathing and palpation. EXAM: CHEST - 2 VIEW  COMPARISON:  Chest radiograph dated January 10, 2017 FINDINGS: The heart size and mediastinal contours are within normal limits. Both lungs are clear. The visualized skeletal structures are unremarkable. IMPRESSION: No active cardiopulmonary disease. Electronically Signed   By: Larose Hires D.O.   On: 03/10/2023 23:11    Procedures Procedures    Medications Ordered in ED Medications  ketorolac (TORADOL) injection 60 mg (60 mg Intramuscular Given 03/11/23 0028)    ED Course/ Medical Decision Making/ A&P                             Medical Decision Making Amount and/or Complexity of Data Reviewed Labs: ordered. Radiology: ordered.  Risk Prescription drug management.   Patient here for evaluation of several days of right-sided chest pain.  He is healthy with no significant medical history does have a family history of coronary artery disease at the age of 46 and his mother.  EKG is without acute ischemic changes and troponins are negative x 2.  Doubt PE he is PERC negative.  He was treated with Toradol for likely musculoskeletal pain with improvement in his symptoms.  No overlying right upper quadrant or abdominal tenderness.  Do not suspect referred abdominal pathology.  Chest x-ray is negative for acute infiltrate or pneumothorax-images personally reviewed and interpreted, agree with radiologist interpretation.  Discussed with patient findings of studies.  Suspect musculoskeletal origin of his pain.  Discussed OTC analgesics as needed pain with outpatient follow-up and return precautions.        Final Clinical Impression(s) / ED Diagnoses Final diagnoses:  Chest wall pain    Rx / DC Orders ED Discharge Orders     None         Tilden Fossa, MD 03/11/23 [phone]

## 2023-03-11 NOTE — ED Notes (Signed)
Cannot discharge, registration in chart.
